# Patient Record
Sex: Male | Born: 1955
Health system: Southern US, Community
[De-identification: ages and names within clinical notes are randomized; demographics above are authoritative.]

## PROBLEM LIST (undated history)

## (undated) DIAGNOSIS — N210 Calculus in bladder: Secondary | ICD-10-CM

## (undated) DIAGNOSIS — Z87442 Personal history of urinary calculi: Secondary | ICD-10-CM

## (undated) DIAGNOSIS — K219 Gastro-esophageal reflux disease without esophagitis: Secondary | ICD-10-CM

## (undated) HISTORY — PX: COLONOSCOPY: SHX174

---

## 2017-10-31 DIAGNOSIS — H1031 Unspecified acute conjunctivitis, right eye: Secondary | ICD-10-CM | POA: Diagnosis not present

## 2017-11-16 DIAGNOSIS — R3989 Other symptoms and signs involving the genitourinary system: Secondary | ICD-10-CM | POA: Diagnosis not present

## 2017-11-16 DIAGNOSIS — R3129 Other microscopic hematuria: Secondary | ICD-10-CM | POA: Diagnosis not present

## 2017-11-21 ENCOUNTER — Other Ambulatory Visit: Payer: Self-pay | Admitting: Family Medicine

## 2017-11-21 DIAGNOSIS — R3129 Other microscopic hematuria: Secondary | ICD-10-CM

## 2017-12-04 DIAGNOSIS — N210 Calculus in bladder: Secondary | ICD-10-CM

## 2017-12-04 HISTORY — DX: Calculus in bladder: N21.0

## 2017-12-18 ENCOUNTER — Ambulatory Visit
Admission: RE | Admit: 2017-12-18 | Discharge: 2017-12-18 | Disposition: A | Discharge status: Home / Self Care | Payer: 59 | Source: Ambulatory Visit | Attending: Family Medicine | Admitting: Family Medicine

## 2017-12-18 DIAGNOSIS — N219 Calculus of lower urinary tract, unspecified: Secondary | ICD-10-CM | POA: Diagnosis not present

## 2017-12-18 DIAGNOSIS — R3129 Other microscopic hematuria: Secondary | ICD-10-CM

## 2018-01-12 DIAGNOSIS — N210 Calculus in bladder: Secondary | ICD-10-CM | POA: Diagnosis not present

## 2018-01-12 DIAGNOSIS — N4341 Spermatocele of epididymis, single: Secondary | ICD-10-CM | POA: Diagnosis not present

## 2018-01-15 ENCOUNTER — Encounter (HOSPITAL_BASED_OUTPATIENT_CLINIC_OR_DEPARTMENT_OTHER): Payer: Self-pay

## 2018-01-15 ENCOUNTER — Other Ambulatory Visit: Payer: Self-pay | Admitting: Urology

## 2018-01-16 ENCOUNTER — Encounter (HOSPITAL_BASED_OUTPATIENT_CLINIC_OR_DEPARTMENT_OTHER): Payer: Self-pay

## 2018-01-16 ENCOUNTER — Other Ambulatory Visit: Payer: Self-pay

## 2018-01-16 NOTE — Progress Notes (Signed)
Spoke with:  Renae FicklePaul NPO:  After Midnight, no gum, candy, or mints   Arrival time: [id number] Labs: N/A AM medications: None Pre op orders: needs second sign Ride home: Darral DashLucretia (wife) [phone]

## 2018-01-19 NOTE — Discharge Instructions (Signed)

## 2018-01-19 NOTE — H&P (Signed)
HPI: Jonathan Hunt is a 62 year-old male with bladder stones.  His problem was discovered 12/18/2017. The diagnosis was made by Dr. Susa Simmonds. He has had no symptoms.   He has not had prior urinary tract or prostate infections.   He does not have urgency. He does not have frequency. He does not have trouble emptying his bladder at this time. He does not have pain or burning when he urinates.   He has not previously had an indwelling catheter in for more than two weeks at a time. He has had no prostate surgery.   01/12/18: He presented with urethral pain and discomfort in the left groin and reports he had intermittently experience these symptoms for approximately 2 years. They have become progressively worse. He has no history of renal calculi. A CT scan was obtained which revealed a 2.4 x 1.9 cm stone in the dependent portion of his bladder with a 2nd smaller stone present as well. No renal calculi were present. He reports having minimal obstructive voiding symptoms with some slight slowing of his stream over the years and may be some slight increased frequency. He gets up at night to urinate but says he sleeps very poorly in therefore gets up to urinate when he is awake at night.     ALLERGIES: Penicillin    MEDICATIONS: None   GU PSH: None   NON-GU PSH: None   GU PMH: None   NON-GU PMH: None   FAMILY HISTORY: 1 Daughter - Runs in Family 1 son - Runs in Family Colon Cancer - Mother   SOCIAL HISTORY: Marital Status: Married Preferred Language: English; Ethnicity: Not Hispanic Or Latino; Race: White Current Smoking Status: Patient has never smoked.   Tobacco Use Assessment Completed: Used Tobacco in last 30 days? Social Drinker.  Drinks 4+ caffeinated drinks per day.    REVIEW OF SYSTEMS:    GU Review Male:   Patient reports get up at night to urinate and penile pain. Patient denies frequent urination, hard to postpone urination, burning/ pain with urination, leakage of urine, stream  starts and stops, trouble starting your stream, have to strain to urinate , and erection problems.  Gastrointestinal (Upper):   Patient denies nausea, vomiting, and indigestion/ heartburn.  Gastrointestinal (Lower):   Patient denies diarrhea and constipation.  Constitutional:   Patient denies fever, night sweats, weight loss, and fatigue.  Skin:   Patient denies skin rash/ lesion and itching.  Eyes:   Patient denies blurred vision and double vision.  Ears/ Nose/ Throat:   Patient denies sore throat and sinus problems.  Hematologic/Lymphatic:   Patient denies swollen glands and easy bruising.  Cardiovascular:   Patient denies leg swelling and chest pains.  Respiratory:   Patient denies cough and shortness of breath.  Endocrine:   Patient denies excessive thirst.  Musculoskeletal:   Patient denies back pain and joint pain.  Neurological:   Patient denies headaches and dizziness.  Psychologic:   Patient denies depression and anxiety.   VITAL SIGNS: None   GU PHYSICAL EXAMINATION:    Anus and Perineum: No hemorrhoids. No anal stenosis. No rectal fissure, no anal fissure. No edema, no dimple, no perineal tenderness, no anal tenderness.  Scrotum: No lesions. No edema. No cysts. No warts.  Epididymides: Left: left head contains a spermatocele. Right: No spermatocele, no masses, no cysts, no tenderness, no induration, no enlargement. Left: No masses, no cysts, no tenderness, no induration, no enlargement.   Testes: No tenderness, no swelling, no  enlargement left testes. No tenderness, no swelling, no enlargement right testes. Normal location left testes. Normal location right testes. No mass, no cyst, no varicocele, no hydrocele left testes. No mass, no cyst, no varicocele, no hydrocele right testes.  Urethral Meatus: Normal size. No lesion, no wart, no discharge, no polyp. Normal location.  Penis: Circumcised, no warts, no cracks. No dorsal Peyronie's plaques, no left corporal Peyronie's plaques, no  right corporal Peyronie's plaques, no scarring, no warts. No balanitis, no meatal stenosis.  Prostate: 40 gram or 2+ size. Left lobe normal consistency, right lobe normal consistency. Symmetrical lobes. No prostate nodule. Left lobe no tenderness, right lobe no tenderness.  Seminal Vesicles: Nonpalpable.  Sphincter Tone: Normal sphincter. No rectal tenderness. No rectal mass.    MULTI-SYSTEM PHYSICAL EXAMINATION:    Constitutional: Well-nourished. No physical deformities. Normally developed. Good grooming.  Neck: Neck symmetrical, not swollen. Normal tracheal position.  Respiratory: No labored breathing, no use of accessory muscles.   Cardiovascular: Normal temperature, normal extremity pulses, no swelling, no varicosities.  Lymphatic: No enlargement of neck, axillae, groin.  Skin: No paleness, no jaundice, no cyanosis. No lesion, no ulcer, no rash.  Neurologic / Psychiatric: Oriented to time, oriented to place, oriented to person. No depression, no anxiety, no agitation.  Gastrointestinal: No mass, no tenderness, no rigidity, non obese abdomen.  Eyes: Normal conjunctivae. Normal eyelids.  Ears, Nose, Mouth, and Throat: Left ear no scars, no lesions, no masses. Right ear no scars, no lesions, no masses. Nose no scars, no lesions, no masses. Normal hearing. Normal lips.  Musculoskeletal: Normal gait and station of head and neck.     PAST DATA REVIEWED:  Source Of History:  Patient  Lab Test Review:   PSA, BUN/Creatinine  Records Review:   Previous Doctor Records, Previous Hospital Records, POC Tool  Urine Test Review:   Urinalysis  X-Ray Review: C.T. Abdomen/Pelvis: Reviewed Films. Reviewed Report. Discussed With Patient. His bladder stone has Hounsfield units of 1400   Notes:                     In 6/19 he was noted to have a normal creatinine of 0.95 with a PSA that was 1.10. His urinalysis had trace blood but no sign of infection.   PROCEDURES:          Urinalysis w/Scope Dipstick  Dipstick Cont'd Micro  Color: Yellow Bilirubin: Neg mg/dL WBC/hpf: 0 - 5/hpf  Appearance: Clear Ketones: Neg mg/dL RBC/hpf: 0 - 2/hpf  Specific Gravity: 1.020 Blood: Neg ery/uL Bacteria: Rare (0-9/hpf)  pH: 5.5 Protein: 1+ mg/dL Cystals: NS (Not Seen)  Glucose: Neg mg/dL Urobilinogen: 0.2 mg/dL Casts: Hyaline    Nitrites: Neg Trichomonas: Not Present    Leukocyte Esterase: Neg leu/uL Mucous: Present      Epithelial Cells: 0 - 5/hpf      Yeast: NS (Not Seen)      Sperm: Not Present    ASSESSMENT/PLAN:     ICD-10 Details  1 GU:   Bladder Stone - N21.0 He would like to proceed with laser lithotripsy of his stone. I therefore discussed the procedure with him in detail. We discussed how the procedure is performed, the probability of success, the risks and complications of the procedure as well as the outpatient nature and the anticipated postoperative course. He understands and has elected to proceed.  2   Spermatocele (single) - N43.41 Left, I noted a spermatocele in the head of his left epididymis. This is never  given him any problems. We discussed the benign nature of this finding.

## 2018-01-22 ENCOUNTER — Encounter (HOSPITAL_BASED_OUTPATIENT_CLINIC_OR_DEPARTMENT_OTHER)
Admission: RE | Disposition: A | Discharge status: Home / Self Care | Payer: Self-pay | Source: Ambulatory Visit | Attending: Urology

## 2018-01-22 ENCOUNTER — Ambulatory Visit (HOSPITAL_BASED_OUTPATIENT_CLINIC_OR_DEPARTMENT_OTHER)
Admission: RE | Admit: 2018-01-22 | Discharge: 2018-01-22 | Disposition: A | Discharge status: Home / Self Care | Payer: 59 | Source: Ambulatory Visit | Attending: Urology | Admitting: Urology

## 2018-01-22 ENCOUNTER — Ambulatory Visit (HOSPITAL_BASED_OUTPATIENT_CLINIC_OR_DEPARTMENT_OTHER): Payer: 59 | Admitting: Anesthesiology

## 2018-01-22 ENCOUNTER — Other Ambulatory Visit: Payer: Self-pay

## 2018-01-22 ENCOUNTER — Encounter (HOSPITAL_BASED_OUTPATIENT_CLINIC_OR_DEPARTMENT_OTHER): Payer: Self-pay | Admitting: Anesthesiology

## 2018-01-22 DIAGNOSIS — N2889 Other specified disorders of kidney and ureter: Secondary | ICD-10-CM | POA: Diagnosis not present

## 2018-01-22 DIAGNOSIS — N210 Calculus in bladder: Secondary | ICD-10-CM | POA: Insufficient documentation

## 2018-01-22 DIAGNOSIS — Z880 Allergy status to penicillin: Secondary | ICD-10-CM | POA: Insufficient documentation

## 2018-01-22 DIAGNOSIS — N3080 Other cystitis without hematuria: Secondary | ICD-10-CM | POA: Diagnosis not present

## 2018-01-22 DIAGNOSIS — I10 Essential (primary) hypertension: Secondary | ICD-10-CM | POA: Diagnosis not present

## 2018-01-22 HISTORY — DX: Calculus in bladder: N21.0

## 2018-01-22 HISTORY — DX: Personal history of urinary calculi: Z87.442

## 2018-01-22 HISTORY — PX: CYSTOSCOPY WITH LITHOLAPAXY: SHX1425

## 2018-01-22 HISTORY — DX: Gastro-esophageal reflux disease without esophagitis: K21.9

## 2018-01-22 SURGERY — CYSTOSCOPY, WITH BLADDER CALCULUS LITHOLAPAXY
Anesthesia: General | Site: Bladder

## 2018-01-22 MED ORDER — LIDOCAINE 2% (20 MG/ML) 5 ML SYRINGE
INTRAMUSCULAR | Status: DC | PRN
  Administered 2018-01-22: 60 mg via INTRAVENOUS

## 2018-01-22 MED ORDER — PHENAZOPYRIDINE HCL 200 MG PO TABS: 200.0000 mg | ORAL_TABLET | Freq: Three times a day (TID) | ORAL | 0 refills | Status: AC | PRN

## 2018-01-22 MED ORDER — FENTANYL CITRATE (PF) 100 MCG/2ML IJ SOLN
INTRAMUSCULAR | Status: AC
  Filled 2018-01-22: qty 2

## 2018-01-22 MED ORDER — INDIGOTINDISULFONATE SODIUM 8 MG/ML IJ SOLN
INTRAMUSCULAR | Status: DC | PRN
  Administered 2018-01-22: 40 mg via INTRAVENOUS

## 2018-01-22 MED ORDER — OXYCODONE HCL 5 MG PO TABS
5.0000 mg | ORAL_TABLET | Freq: Once | ORAL | Status: DC | PRN
  Filled 2018-01-22: qty 1

## 2018-01-22 MED ORDER — ONDANSETRON HCL 4 MG/2ML IJ SOLN
4.0000 mg | Freq: Once | INTRAMUSCULAR | Status: DC | PRN
  Filled 2018-01-22: qty 2

## 2018-01-22 MED ORDER — KETOROLAC TROMETHAMINE 30 MG/ML IJ SOLN
INTRAMUSCULAR | Status: AC
  Filled 2018-01-22: qty 1

## 2018-01-22 MED ORDER — MIDAZOLAM HCL 5 MG/5ML IJ SOLN
INTRAMUSCULAR | Status: DC | PRN
  Administered 2018-01-22: 2 mg via INTRAVENOUS

## 2018-01-22 MED ORDER — DEXAMETHASONE SODIUM PHOSPHATE 10 MG/ML IJ SOLN
INTRAMUSCULAR | Status: AC
  Filled 2018-01-22: qty 1

## 2018-01-22 MED ORDER — IBUPROFEN 100 MG/5ML PO SUSP
200.0000 mg | Freq: Four times a day (QID) | ORAL | Status: DC | PRN
  Filled 2018-01-22: qty 30

## 2018-01-22 MED ORDER — DEXAMETHASONE SODIUM PHOSPHATE 4 MG/ML IJ SOLN
INTRAMUSCULAR | Status: DC | PRN
  Administered 2018-01-22: 10 mg via INTRAVENOUS

## 2018-01-22 MED ORDER — MIDAZOLAM HCL 2 MG/2ML IJ SOLN
INTRAMUSCULAR | Status: AC
  Filled 2018-01-22: qty 2

## 2018-01-22 MED ORDER — HYDROCODONE-ACETAMINOPHEN 10-325 MG PO TABS: 1.0000 | ORAL_TABLET | ORAL | 0 refills | Status: AC | PRN

## 2018-01-22 MED ORDER — ONDANSETRON HCL 4 MG/2ML IJ SOLN
INTRAMUSCULAR | Status: DC | PRN
  Administered 2018-01-22: 4 mg via INTRAVENOUS

## 2018-01-22 MED ORDER — IBUPROFEN 200 MG PO TABS
200.0000 mg | ORAL_TABLET | Freq: Four times a day (QID) | ORAL | Status: DC | PRN
  Filled 2018-01-22: qty 2

## 2018-01-22 MED ORDER — PHENYLEPHRINE 40 MCG/ML (10ML) SYRINGE FOR IV PUSH (FOR BLOOD PRESSURE SUPPORT)
PREFILLED_SYRINGE | INTRAVENOUS | Status: DC | PRN
  Administered 2018-01-22: 40 ug via INTRAVENOUS

## 2018-01-22 MED ORDER — FENTANYL CITRATE (PF) 100 MCG/2ML IJ SOLN
INTRAMUSCULAR | Status: DC | PRN
  Administered 2018-01-22 (×2): 50 ug via INTRAVENOUS

## 2018-01-22 MED ORDER — PROPOFOL 10 MG/ML IV BOLUS
INTRAVENOUS | Status: DC | PRN
  Administered 2018-01-22: 160 mg via INTRAVENOUS

## 2018-01-22 MED ORDER — ONDANSETRON HCL 4 MG/2ML IJ SOLN
INTRAMUSCULAR | Status: AC
  Filled 2018-01-22: qty 2

## 2018-01-22 MED ORDER — FENTANYL CITRATE (PF) 100 MCG/2ML IJ SOLN
25.0000 ug | INTRAMUSCULAR | Status: DC | PRN
  Administered 2018-01-22: 25 ug via INTRAVENOUS
  Filled 2018-01-22: qty 1

## 2018-01-22 MED ORDER — OXYCODONE HCL 5 MG/5ML PO SOLN
5.0000 mg | Freq: Once | ORAL | Status: DC | PRN
  Filled 2018-01-22: qty 5

## 2018-01-22 MED ORDER — SODIUM CHLORIDE 0.9 % IR SOLN
Status: DC | PRN
  Administered 2018-01-22 (×3): 3000 mL via INTRAVESICAL

## 2018-01-22 MED ORDER — KETOROLAC TROMETHAMINE 30 MG/ML IJ SOLN
30.0000 mg | Freq: Once | INTRAMUSCULAR | Status: AC | PRN
  Administered 2018-01-22: 30 mg via INTRAVENOUS
  Filled 2018-01-22: qty 1

## 2018-01-22 MED ORDER — STERILE WATER FOR IRRIGATION IR SOLN
Status: DC | PRN
  Administered 2018-01-22: 3000 mL

## 2018-01-22 MED ORDER — LACTATED RINGERS IV SOLN
INTRAVENOUS | Status: DC
  Administered 2018-01-22 (×2): via INTRAVENOUS
  Filled 2018-01-22: qty 1000

## 2018-01-22 MED ORDER — LIDOCAINE 2% (20 MG/ML) 5 ML SYRINGE
INTRAMUSCULAR | Status: AC
  Filled 2018-01-22: qty 5

## 2018-01-22 MED ORDER — CIPROFLOXACIN IN D5W 400 MG/200ML IV SOLN
INTRAVENOUS | Status: AC
  Filled 2018-01-22: qty 200

## 2018-01-22 MED ORDER — CIPROFLOXACIN IN D5W 400 MG/200ML IV SOLN
400.0000 mg | Freq: Once | INTRAVENOUS | Status: AC
  Administered 2018-01-22: 400 mg via INTRAVENOUS
  Filled 2018-01-22: qty 200

## 2018-01-22 MED ORDER — MEPERIDINE HCL 25 MG/ML IJ SOLN
6.2500 mg | INTRAMUSCULAR | Status: DC | PRN
  Filled 2018-01-22: qty 1

## 2018-01-22 SURGICAL SUPPLY — 27 items
BAG DRAIN URO-CYSTO SKYTR STRL (DRAIN) ×2 IMPLANT
BAG URINE LEG 19OZ MD ST LTX (BAG) ×2 IMPLANT
CATH FOLEY 2WAY SLVR  5CC 20FR (CATHETERS) ×1
CATH FOLEY 2WAY SLVR 5CC 20FR (CATHETERS) ×1 IMPLANT
CATH INTERMIT  6FR 70CM (CATHETERS) IMPLANT
CATH URET 5FR 28IN CONE TIP (BALLOONS)
CATH URET 5FR 70CM CONE TIP (BALLOONS) IMPLANT
CLOTH BEACON ORANGE TIMEOUT ST (SAFETY) ×2 IMPLANT
GLOVE BIO SURGEON STRL SZ 6.5 (GLOVE) ×2 IMPLANT
GLOVE BIO SURGEON STRL SZ8 (GLOVE) ×2 IMPLANT
GLOVE BIOGEL PI IND STRL 6.5 (GLOVE) ×1 IMPLANT
GLOVE BIOGEL PI INDICATOR 6.5 (GLOVE) ×1
GOWN STRL REUS W/ TWL XL LVL3 (GOWN DISPOSABLE) ×1 IMPLANT
GOWN STRL REUS W/TWL LRG LVL3 (GOWN DISPOSABLE) ×2 IMPLANT
GOWN STRL REUS W/TWL XL LVL3 (GOWN DISPOSABLE) ×1
GOWN XL W/COTTON TOWEL STD (GOWNS) ×2 IMPLANT
GUIDEWIRE 0.038 PTFE COATED (WIRE) ×2 IMPLANT
GUIDEWIRE ANG ZIPWIRE 038X150 (WIRE) IMPLANT
GUIDEWIRE STR DUAL SENSOR (WIRE) ×2 IMPLANT
IV NS IRRIG 3000ML ARTHROMATIC (IV SOLUTION) ×6 IMPLANT
KIT TURNOVER CYSTO (KITS) ×2 IMPLANT
LOOP CUT BIPOLAR 24F LRG (ELECTROSURGICAL) ×2 IMPLANT
MANIFOLD NEPTUNE II (INSTRUMENTS) ×2 IMPLANT
NS IRRIG 500ML POUR BTL (IV SOLUTION) IMPLANT
PACK CYSTO (CUSTOM PROCEDURE TRAY) ×2 IMPLANT
TUBE CONNECTING 12X1/4 (SUCTIONS) ×2 IMPLANT
WATER STERILE IRR 3000ML UROMA (IV SOLUTION) ×2 IMPLANT

## 2018-01-22 NOTE — Op Note (Signed)
PATIENT:  Jonathan Raveaul Edward Huynh Jr.  PRE-OPERATIVE DIAGNOSIS: Bladder calculi  POST-OPERATIVE DIAGNOSIS: 1.  Bladder calculi (2.4 cm) 2.  Left ureterocele  PROCEDURE: 1.  Transurethral unroofing of ureterocele. 2.  Cystolitholapaxy  SURGEON:  Garnett FarmMark C Nixon Kolton  INDICATION: Jonathan Raveaul Edward Calia Jr. is a 62 year old male who was experiencing urethral discomfort and left groin pain intermittently over 2 years.  The pain had become progressively worse and a CT scan was obtained which revealed a 2.4 cm stone in the dependent portion of the bladder with a second smaller stone adjacent to it.  There was no evidence of hydronephrosis.  He is brought to the operating room today for cystolitholapaxy.  ANESTHESIA:  General  EBL:  Minimal  DRAINS: 20 French Foley catheter  LOCAL MEDICATIONS USED:  None  SPECIMEN: 1.  Portions of ureterocele to pathology. 2.  Stone given to patient.  Description of procedure: After informed consent the patient was taken to the operating room and placed on the table in a supine position. General anesthesia was then administered. Once fully anesthetized the patient was moved to the dorsal lithotomy position and the genitalia were sterilely prepped and draped in standard fashion. An official timeout was then performed.  I passed the 23 French rigid cystoscope with 30 degree lens down the urethra which was noted to be normal.  The prostatic urethra revealed elongation with some lateral lobe hypertrophy and a high bladder neck.  Upon entering the bladder I noted 1+ trabeculation.  The right UO was noted to be normal but there was a very inflamed appearing mass in the location of the left ureteral orifice.  I used a 70 degree lens but was unable to find the ureteral orifice.  I also could not see the stone.  It appeared obvious to me that the stone was in a large ureterocele.  I administered 1 ampoule of indigo carmine and then inserted the resectoscope with loop.  I could see  indigocarmine coming from the right UO but was not able to definitively identify the left UO although I did see blue coming from the region of the mass.  I used the loop to incise over the top of the mass and in doing so exposed the large stone.  I resected about one half of the ureterocele and then used the beak of the scope to tease the stone from its current location.  The second stone was removed from that location as well and both were noted on the floor of the bladder.  I inspected the ureterocele and found a very large left ureteral orifice at the base.  I then fulgurated the edges of the ureterocele to control bleeding and then remove the resectoscope.  I reinserted the cystoscope and used the 1000 m holmium laser fiber to fragment the stone.  Once it was fully fragmented I reinserted the resectoscope sheath and used the Microvasive evacuator to remove all stone fragments.  Reinspection of the bladder revealed all stone had been removed, the mucosa was intact and there was minimal bleeding.  I used the resectoscope loop to then fulgurate areas of small oozing from the edge of the resected ureterocele and also at the bladder neck at the 6 o'clock position where the scope and passed into the bladder over the high bladder neck.  The bladder was then drained, the resectoscope was removed and a 20 French Foley catheter was inserted.  The patient was then awakened and taken to the recovery room in stable and  satisfactory condition.  He tolerated the procedure well with no intraoperative complications.  PLAN OF CARE: Discharge to home after PACU  PATIENT DISPOSITION:  PACU - hemodynamically stable.

## 2018-01-22 NOTE — Anesthesia Postprocedure Evaluation (Signed)
Anesthesia Post Note  Patient: Jonathan Raveaul Edward Saxer Jr.  Procedure(s) Performed: CYSTOSCOPY WITH LITHOLAPAXY (N/A Bladder)     Patient location during evaluation: PACU Anesthesia Type: General Level of consciousness: sedated Pain management: pain level controlled Vital Signs Assessment: post-procedure vital signs reviewed and stable Respiratory status: spontaneous breathing and respiratory function stable Cardiovascular status: stable Postop Assessment: no apparent nausea or vomiting Anesthetic complications: no    Last Vitals:  Vitals:   01/22/18 1200 01/22/18 1215  BP: 120/77 133/88  Pulse: (!) 49 (!) 52  Resp: 15 14  Temp:    SpO2: 98% 95%    Last Pain:  Vitals:   01/22/18 1215  TempSrc:   PainSc: 3                  Jonathan Hunt

## 2018-01-22 NOTE — Anesthesia Procedure Notes (Signed)
Procedure Name: LMA Insertion Date/Time: 01/22/2018 10:11 AM Performed by: Caren Macadamarter, Joneisha Miles W, CRNA Pre-anesthesia Checklist: Patient identified, Emergency Drugs available, Suction available and Patient being monitored Patient Re-evaluated:Patient Re-evaluated prior to induction Oxygen Delivery Method: Circle system utilized Preoxygenation: Pre-oxygenation with 100% oxygen Induction Type: IV induction Ventilation: Mask ventilation without difficulty LMA: LMA inserted LMA Size: 5.0 Number of attempts: 1 Placement Confirmation: positive ETCO2 and breath sounds checked- equal and bilateral Tube secured with: Tape Dental Injury: Teeth and Oropharynx as per pre-operative assessment

## 2018-01-22 NOTE — Anesthesia Preprocedure Evaluation (Signed)
Anesthesia Evaluation  Patient identified by MRN, date of birth, ID band Patient awake    Reviewed: Allergy & Precautions, NPO status , Patient's Chart, lab work & pertinent test results  Airway Mallampati: I       Dental no notable dental hx. (+) Teeth Intact   Pulmonary neg pulmonary ROS,    Pulmonary exam normal breath sounds clear to auscultation       Cardiovascular hypertension, Normal cardiovascular exam Rhythm:Regular Rate:Normal     Neuro/Psych negative neurological ROS  negative psych ROS   GI/Hepatic Neg liver ROS,   Endo/Other  negative endocrine ROS  Renal/GU negative Renal ROS     Musculoskeletal negative musculoskeletal ROS (+)   Abdominal Normal abdominal exam  (+)   Peds  Hematology   Anesthesia Other Findings   Reproductive/Obstetrics                             Anesthesia Physical Anesthesia Plan  ASA: II  Anesthesia Plan: General   Post-op Pain Management:    Induction: Intravenous  PONV Risk Score and Plan: 4 or greater and Ondansetron, Dexamethasone and Midazolam  Airway Management Planned: LMA  Additional Equipment:   Intra-op Plan:   Post-operative Plan: Extubation in OR  Informed Consent: I have reviewed the patients History and Physical, chart, labs and discussed the procedure including the risks, benefits and alternatives for the proposed anesthesia with the patient or authorized representative who has indicated his/her understanding and acceptance.   Dental advisory given  Plan Discussed with: CRNA and Surgeon  Anesthesia Plan Comments:         Anesthesia Quick Evaluation

## 2018-01-22 NOTE — Transfer of Care (Signed)
Immediate Anesthesia Transfer of Care Note  Patient: Jonathan Raveaul Edward Phimmasone Jr.  Procedure(s) Performed: CYSTOSCOPY WITH LITHOLAPAXY (N/A Bladder)  Patient Location: PACU  Anesthesia Type:General  Level of Consciousness: awake, alert  and oriented  Airway & Oxygen Therapy: Patient Spontanous Breathing and Patient connected to face mask oxygen  Post-op Assessment: Report given to RN and Post -op Vital signs reviewed and stable  Post vital signs: Reviewed and stable  Last Vitals:  Vitals Value Taken Time  BP    Temp    Pulse    Resp    SpO2      Last Pain:  Vitals:   01/22/18 0824  TempSrc:   PainSc: 0-No pain      Patients Stated Pain Goal: 4 (01/22/18 0824)  Complications: No apparent anesthesia complications

## 2018-01-23 ENCOUNTER — Encounter (HOSPITAL_BASED_OUTPATIENT_CLINIC_OR_DEPARTMENT_OTHER): Payer: Self-pay | Admitting: Urology

## 2018-02-01 DIAGNOSIS — N210 Calculus in bladder: Secondary | ICD-10-CM | POA: Diagnosis not present

## 2018-07-12 DIAGNOSIS — Z1159 Encounter for screening for other viral diseases: Secondary | ICD-10-CM | POA: Diagnosis not present

## 2018-07-12 DIAGNOSIS — E782 Mixed hyperlipidemia: Secondary | ICD-10-CM | POA: Diagnosis not present

## 2018-07-12 DIAGNOSIS — Z0000 Encounter for general adult medical examination without abnormal findings: Secondary | ICD-10-CM | POA: Diagnosis not present

## 2018-07-12 DIAGNOSIS — Z23 Encounter for immunization: Secondary | ICD-10-CM | POA: Diagnosis not present

## 2019-06-28 ENCOUNTER — Ambulatory Visit: Payer: Medicare Other | Attending: Internal Medicine

## 2019-06-28 DIAGNOSIS — Z23 Encounter for immunization: Secondary | ICD-10-CM

## 2019-06-28 NOTE — Progress Notes (Signed)
   [id number]  Name:  Rachel Samples.    MRN: [id number] DOB: September 01, 1955  06/28/2019  Mr. Favata was observed post Covid-19 immunization for 15 minutes without incidence. He was provided with Vaccine Information Sheet and instruction to access the V-Safe system.   Mr. Fearn was instructed to call 911 with any severe reactions post vaccine: Marland Kitchen Difficulty breathing  . Swelling of your face and throat  . A fast heartbeat  . A bad rash all over your body  . Dizziness and weakness    Immunizations Administered    Name Date Dose VIS Date Route   Pfizer COVID-19 Vaccine 06/28/2019  4:41 PM 0.3 mL 05/17/2019 Intramuscular   Manufacturer: [employer], [employer]   Lot: [id number]   NDC: [address]-4   Pfizer COVID-19 Vaccine 06/28/2019  4:47 PM 0.3 mL 05/17/2019 Intramuscular   Manufacturer: [employer], [employer]   Lot: [id number]   NDC: [address]-7

## 2019-07-19 ENCOUNTER — Ambulatory Visit: Payer: Medicare Other | Attending: Internal Medicine

## 2019-07-19 DIAGNOSIS — Z23 Encounter for immunization: Secondary | ICD-10-CM | POA: Insufficient documentation

## 2019-07-19 NOTE — Progress Notes (Signed)
   [id number]  Name:  Woods Gangemi.    MRN: [id number] DOB: June 08, 1955  07/19/2019  Mr. Hausen was observed post Covid-19 immunization for 15 minutes without incidence. He was provided with Vaccine Information Sheet and instruction to access the V-Safe system.   Mr. Yero was instructed to call 911 with any severe reactions post vaccine: Marland Kitchen Difficulty breathing  . Swelling of your face and throat  . A fast heartbeat  . A bad rash all over your body  . Dizziness and weakness    Immunizations Administered    Name Date Dose VIS Date Route   Pfizer COVID-19 Vaccine 07/19/2019  4:19 PM 0.3 mL 05/17/2019 Intramuscular   Manufacturer: [employer], [employer]   Lot: [id number]   NDC: [address]-6

## 2019-10-19 IMAGING — CT CT ABD-PELV W/O CM
2 of 4 series · 13 of 46 positions shown, 15 images · non-contrast
Comparison: None.

CLINICAL DATA: Records. Hematuria, dysuria and left lower quadrant
abdominal discomfort.

EXAM:
CT ABDOMEN AND PELVIS WITHOUT CONTRAST
TECHNIQUE: Multidetector CT imaging of the abdomen and pelvis was performed
following the standard protocol without IV contrast.

[Series 2: renal stone 5.00 br40 s3 ax · axial · 0.62mm/px · z∈[+1180,+1585]mm · 10 of 99 slices shown, 12 images]
[im 9/99  soft-tissue]
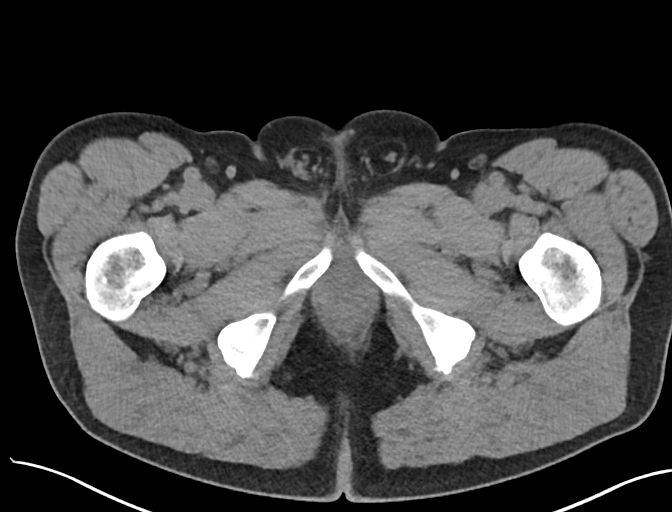
[im 9/99  bone]
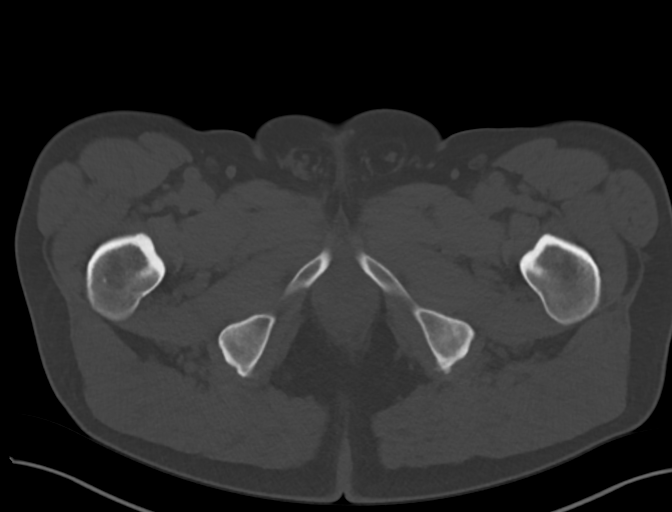
[im 17/99  soft-tissue]
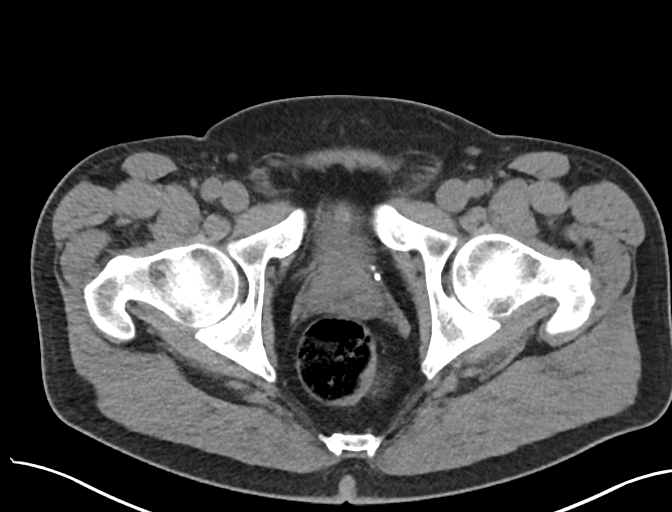
[im 25/99  soft-tissue]
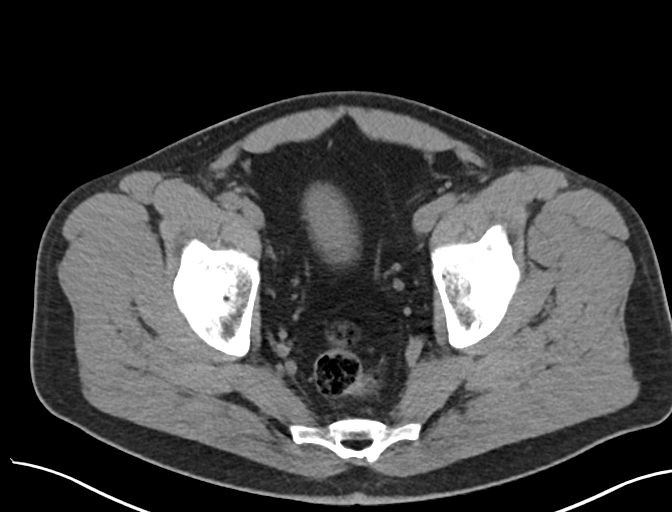
[im 37/99  soft-tissue]
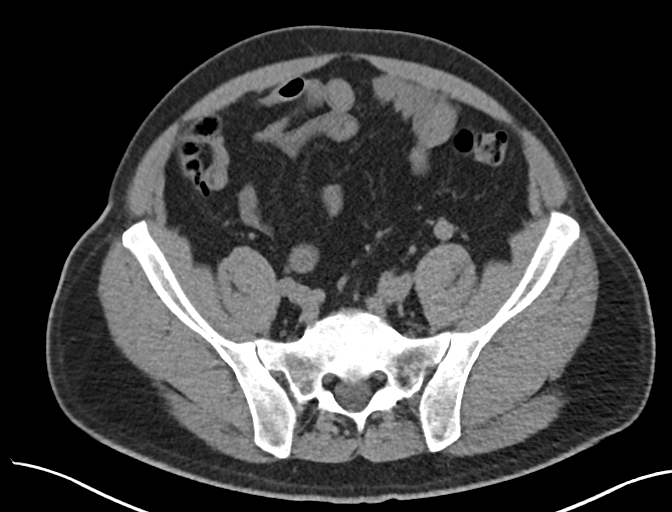
[im 45/99  soft-tissue]
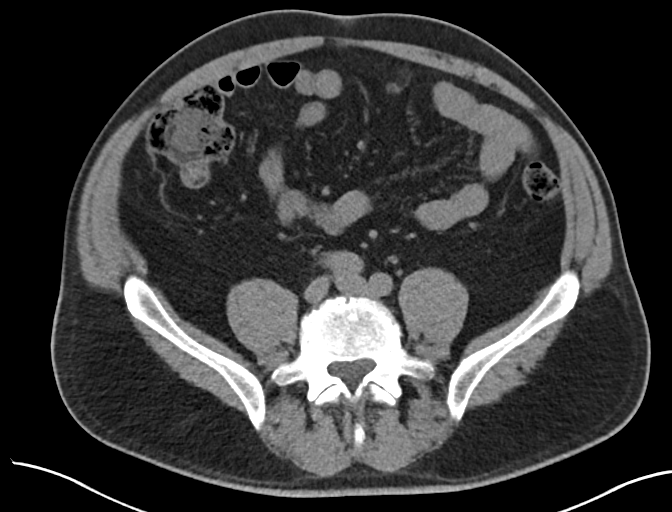
[im 54/99  soft-tissue]
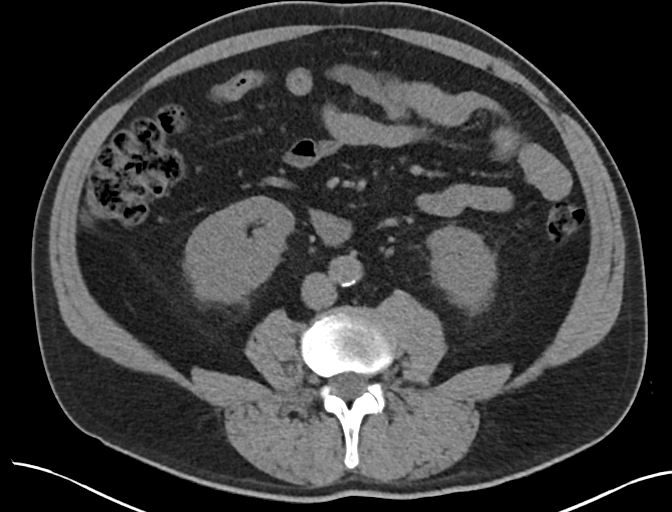
[im 62/99  soft-tissue]
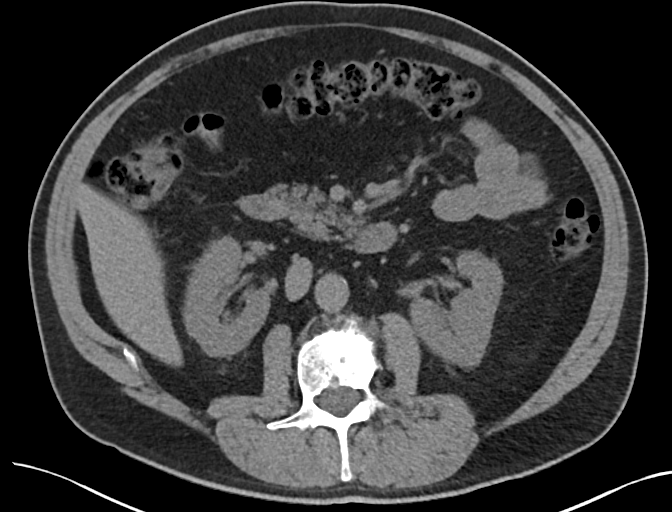
[im 74/99  soft-tissue]
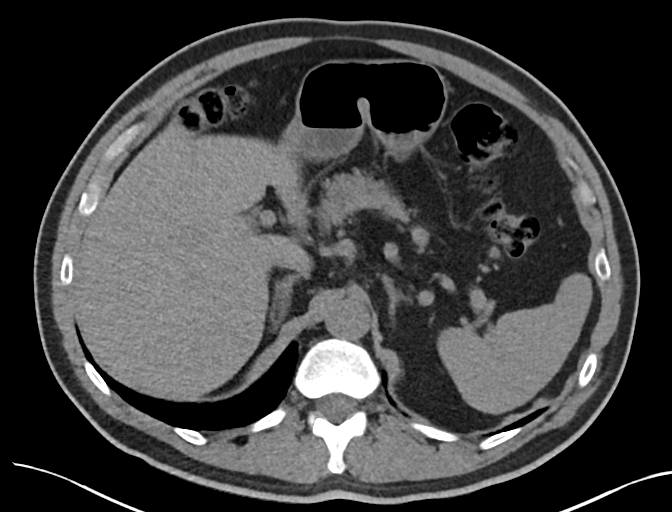
[im 82/99  soft-tissue]
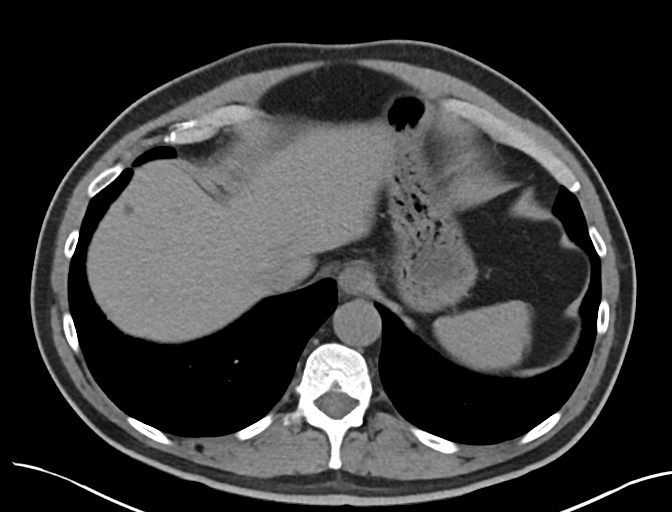
[im 82/99  bone]
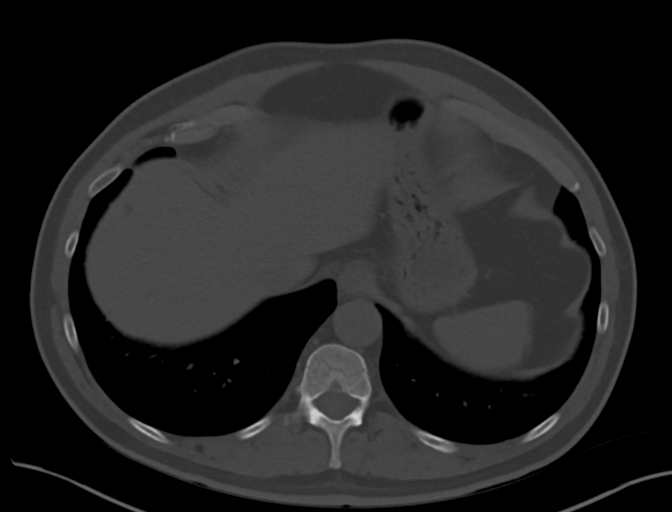
[im 90/99  soft-tissue]
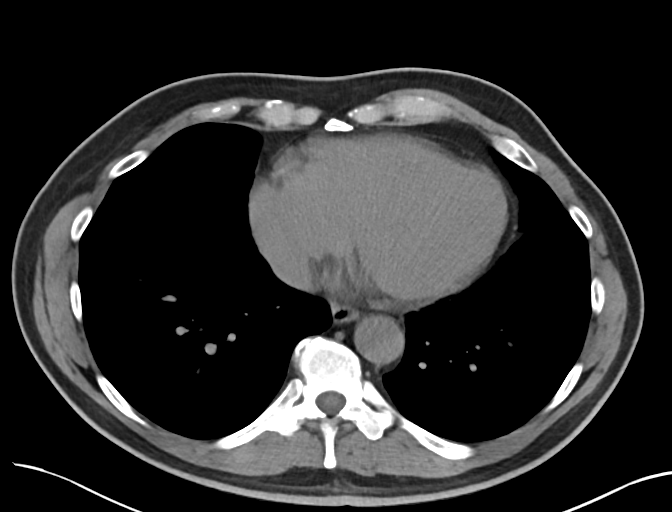

[Series 6: renal stone 2.00 br40 s3 cor · coronal · 0.81mm/px · 3 of 165 slices shown]
[im 55/165  soft-tissue]
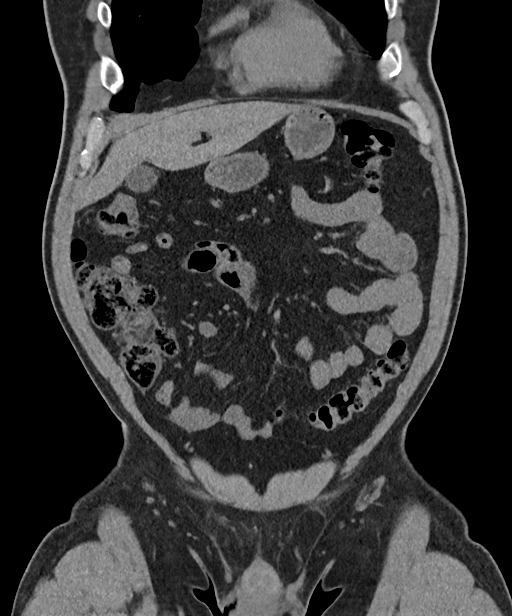
[im 73/165  soft-tissue]
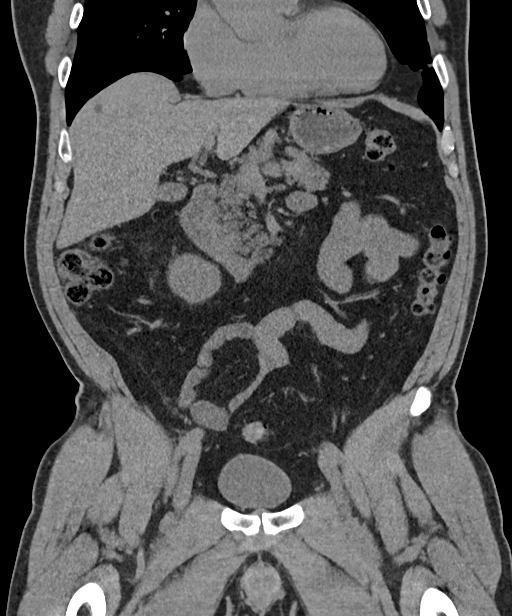
[im 92/165  soft-tissue]
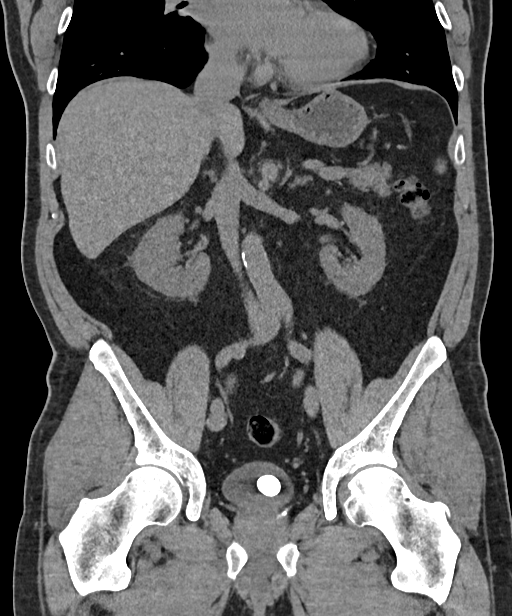

[13 of 46 positions shown; findings below may reference images not displayed]

FINDINGS: Lower chest: No acute abnormality.

Hepatobiliary: There are some scattered benign cysts in the liver.
The largest measures 1.6 cm at the dome of the right lobe.. No
gallstones, gallbladder wall thickening, or biliary dilatation.

Pancreas: Unremarkable. No pancreatic ductal dilatation or
surrounding inflammatory changes.

Spleen: Normal in size without focal abnormality.

Adrenals/Urinary Tract: Adrenal glands and kidneys have a normal
appearance by unenhanced CT. No evidence of hydronephrosis. No renal
or ureteral calculi are identified. There is a densely calcified
calculus within the dependent bladder measuring approximately 2.4 x
1.9 x 1.9 cm.

Stomach/Bowel: Stomach is within normal limits. Bowel shows no
evidence of obstruction or inflammation. No free air. No focal
lesions identified. Normal appendix.

Vascular/Lymphatic: No significant vascular findings are present. No
enlarged abdominal or pelvic lymph nodes.

Reproductive: Prostate is unremarkable.

Other: No abdominal wall hernia or abnormality. No abdominopelvic
ascites.

Musculoskeletal: Advanced degenerative disc disease present at L2-3
with disc space narrowing, prominent endplate sclerosis and roughly
5 mm retrolisthesis of L2 on L3.
IMPRESSION: Bladder calculus measuring approximately 2.4 cm in greatest
diameter. No renal or ureteral calculi identified.

## 2020-07-21 DIAGNOSIS — Z1389 Encounter for screening for other disorder: Secondary | ICD-10-CM | POA: Diagnosis not present

## 2020-07-21 DIAGNOSIS — Z23 Encounter for immunization: Secondary | ICD-10-CM | POA: Diagnosis not present

## 2020-07-21 DIAGNOSIS — R030 Elevated blood-pressure reading, without diagnosis of hypertension: Secondary | ICD-10-CM | POA: Diagnosis not present

## 2020-07-21 DIAGNOSIS — E782 Mixed hyperlipidemia: Secondary | ICD-10-CM | POA: Diagnosis not present

## 2020-07-21 DIAGNOSIS — Z0000 Encounter for general adult medical examination without abnormal findings: Secondary | ICD-10-CM | POA: Diagnosis not present

## 2021-07-29 DIAGNOSIS — Z0000 Encounter for general adult medical examination without abnormal findings: Secondary | ICD-10-CM | POA: Diagnosis not present

## 2021-07-29 DIAGNOSIS — I10 Essential (primary) hypertension: Secondary | ICD-10-CM | POA: Diagnosis not present

## 2021-07-29 DIAGNOSIS — E782 Mixed hyperlipidemia: Secondary | ICD-10-CM | POA: Diagnosis not present

## 2021-07-29 DIAGNOSIS — Z800 Family history of malignant neoplasm of digestive organs: Secondary | ICD-10-CM | POA: Diagnosis not present

## 2021-07-29 DIAGNOSIS — Z1389 Encounter for screening for other disorder: Secondary | ICD-10-CM | POA: Diagnosis not present

## 2021-08-17 DIAGNOSIS — I10 Essential (primary) hypertension: Secondary | ICD-10-CM | POA: Diagnosis not present

## 2021-08-31 DIAGNOSIS — I10 Essential (primary) hypertension: Secondary | ICD-10-CM | POA: Diagnosis not present

## 2022-01-25 DIAGNOSIS — I10 Essential (primary) hypertension: Secondary | ICD-10-CM | POA: Diagnosis not present

## 2022-01-25 DIAGNOSIS — M5451 Vertebrogenic low back pain: Secondary | ICD-10-CM | POA: Diagnosis not present

## 2022-01-25 DIAGNOSIS — G8929 Other chronic pain: Secondary | ICD-10-CM | POA: Diagnosis not present

## 2022-01-25 DIAGNOSIS — E782 Mixed hyperlipidemia: Secondary | ICD-10-CM | POA: Diagnosis not present

## 2022-01-25 DIAGNOSIS — Z23 Encounter for immunization: Secondary | ICD-10-CM | POA: Diagnosis not present

## 2022-02-02 DIAGNOSIS — M545 Low back pain, unspecified: Secondary | ICD-10-CM | POA: Diagnosis not present

## 2022-02-09 DIAGNOSIS — M545 Low back pain, unspecified: Secondary | ICD-10-CM | POA: Diagnosis not present

## 2022-08-18 DIAGNOSIS — G4700 Insomnia, unspecified: Secondary | ICD-10-CM | POA: Diagnosis not present

## 2022-08-18 DIAGNOSIS — Z800 Family history of malignant neoplasm of digestive organs: Secondary | ICD-10-CM | POA: Diagnosis not present

## 2022-08-18 DIAGNOSIS — Z1331 Encounter for screening for depression: Secondary | ICD-10-CM | POA: Diagnosis not present

## 2022-08-18 DIAGNOSIS — Z6830 Body mass index (BMI) 30.0-30.9, adult: Secondary | ICD-10-CM | POA: Diagnosis not present

## 2022-08-18 DIAGNOSIS — E782 Mixed hyperlipidemia: Secondary | ICD-10-CM | POA: Diagnosis not present

## 2022-08-18 DIAGNOSIS — Z125 Encounter for screening for malignant neoplasm of prostate: Secondary | ICD-10-CM | POA: Diagnosis not present

## 2022-08-18 DIAGNOSIS — M545 Low back pain, unspecified: Secondary | ICD-10-CM | POA: Diagnosis not present

## 2022-08-18 DIAGNOSIS — Z0000 Encounter for general adult medical examination without abnormal findings: Secondary | ICD-10-CM | POA: Diagnosis not present

## 2022-08-18 DIAGNOSIS — I10 Essential (primary) hypertension: Secondary | ICD-10-CM | POA: Diagnosis not present

## 2022-10-06 DIAGNOSIS — Z880 Allergy status to penicillin: Secondary | ICD-10-CM | POA: Diagnosis not present

## 2022-10-06 DIAGNOSIS — E785 Hyperlipidemia, unspecified: Secondary | ICD-10-CM | POA: Diagnosis not present

## 2022-10-06 DIAGNOSIS — E669 Obesity, unspecified: Secondary | ICD-10-CM | POA: Diagnosis not present

## 2022-10-06 DIAGNOSIS — Z6831 Body mass index (BMI) 31.0-31.9, adult: Secondary | ICD-10-CM | POA: Diagnosis not present

## 2022-10-06 DIAGNOSIS — I10 Essential (primary) hypertension: Secondary | ICD-10-CM | POA: Diagnosis not present

## 2022-10-06 DIAGNOSIS — Z008 Encounter for other general examination: Secondary | ICD-10-CM | POA: Diagnosis not present

## 2022-10-06 DIAGNOSIS — Z809 Family history of malignant neoplasm, unspecified: Secondary | ICD-10-CM | POA: Diagnosis not present

## 2022-10-12 DIAGNOSIS — G4719 Other hypersomnia: Secondary | ICD-10-CM | POA: Diagnosis not present

## 2022-10-12 DIAGNOSIS — I10 Essential (primary) hypertension: Secondary | ICD-10-CM | POA: Diagnosis not present

## 2022-10-27 DIAGNOSIS — G4733 Obstructive sleep apnea (adult) (pediatric): Secondary | ICD-10-CM | POA: Diagnosis not present

## 2023-07-12 DIAGNOSIS — K080 Exfoliation of teeth due to systemic causes: Secondary | ICD-10-CM | POA: Diagnosis not present

## 2023-08-29 DIAGNOSIS — J301 Allergic rhinitis due to pollen: Secondary | ICD-10-CM | POA: Diagnosis not present

## 2023-08-29 DIAGNOSIS — I10 Essential (primary) hypertension: Secondary | ICD-10-CM | POA: Diagnosis not present

## 2023-08-29 DIAGNOSIS — L03213 Periorbital cellulitis: Secondary | ICD-10-CM | POA: Diagnosis not present

## 2023-08-31 DIAGNOSIS — I10 Essential (primary) hypertension: Secondary | ICD-10-CM | POA: Diagnosis not present

## 2023-08-31 DIAGNOSIS — E782 Mixed hyperlipidemia: Secondary | ICD-10-CM | POA: Diagnosis not present

## 2023-08-31 DIAGNOSIS — Z125 Encounter for screening for malignant neoplasm of prostate: Secondary | ICD-10-CM | POA: Diagnosis not present

## 2023-09-04 DIAGNOSIS — Z0000 Encounter for general adult medical examination without abnormal findings: Secondary | ICD-10-CM | POA: Diagnosis not present

## 2023-09-04 DIAGNOSIS — Z1331 Encounter for screening for depression: Secondary | ICD-10-CM | POA: Diagnosis not present

## 2023-09-04 DIAGNOSIS — E782 Mixed hyperlipidemia: Secondary | ICD-10-CM | POA: Diagnosis not present

## 2023-09-04 DIAGNOSIS — I10 Essential (primary) hypertension: Secondary | ICD-10-CM | POA: Diagnosis not present

## 2023-09-04 DIAGNOSIS — G4733 Obstructive sleep apnea (adult) (pediatric): Secondary | ICD-10-CM | POA: Diagnosis not present

## 2023-09-04 DIAGNOSIS — E66811 Obesity, class 1: Secondary | ICD-10-CM | POA: Diagnosis not present

## 2023-10-10 DIAGNOSIS — G4733 Obstructive sleep apnea (adult) (pediatric): Secondary | ICD-10-CM | POA: Diagnosis not present

## 2023-10-10 DIAGNOSIS — I10 Essential (primary) hypertension: Secondary | ICD-10-CM | POA: Diagnosis not present

## 2023-10-10 DIAGNOSIS — Z6830 Body mass index (BMI) 30.0-30.9, adult: Secondary | ICD-10-CM | POA: Diagnosis not present

## 2023-10-10 DIAGNOSIS — Z131 Encounter for screening for diabetes mellitus: Secondary | ICD-10-CM | POA: Diagnosis not present

## 2023-10-10 DIAGNOSIS — E782 Mixed hyperlipidemia: Secondary | ICD-10-CM | POA: Diagnosis not present

## 2023-10-10 DIAGNOSIS — Z1331 Encounter for screening for depression: Secondary | ICD-10-CM | POA: Diagnosis not present

## 2023-10-10 DIAGNOSIS — E559 Vitamin D deficiency, unspecified: Secondary | ICD-10-CM | POA: Diagnosis not present

## 2023-10-10 DIAGNOSIS — E8889 Other specified metabolic disorders: Secondary | ICD-10-CM | POA: Diagnosis not present

## 2023-10-10 DIAGNOSIS — E66811 Obesity, class 1: Secondary | ICD-10-CM | POA: Diagnosis not present

## 2023-10-24 DIAGNOSIS — G4733 Obstructive sleep apnea (adult) (pediatric): Secondary | ICD-10-CM | POA: Diagnosis not present

## 2023-10-24 DIAGNOSIS — E782 Mixed hyperlipidemia: Secondary | ICD-10-CM | POA: Diagnosis not present

## 2023-10-24 DIAGNOSIS — E559 Vitamin D deficiency, unspecified: Secondary | ICD-10-CM | POA: Diagnosis not present

## 2023-10-24 DIAGNOSIS — I10 Essential (primary) hypertension: Secondary | ICD-10-CM | POA: Diagnosis not present

## 2023-10-26 DIAGNOSIS — G4733 Obstructive sleep apnea (adult) (pediatric): Secondary | ICD-10-CM | POA: Diagnosis not present

## 2023-11-06 DIAGNOSIS — G4733 Obstructive sleep apnea (adult) (pediatric): Secondary | ICD-10-CM | POA: Diagnosis not present

## 2023-11-07 DIAGNOSIS — I10 Essential (primary) hypertension: Secondary | ICD-10-CM | POA: Diagnosis not present

## 2023-11-07 DIAGNOSIS — E782 Mixed hyperlipidemia: Secondary | ICD-10-CM | POA: Diagnosis not present

## 2023-11-07 DIAGNOSIS — G4733 Obstructive sleep apnea (adult) (pediatric): Secondary | ICD-10-CM | POA: Diagnosis not present

## 2023-11-07 DIAGNOSIS — E559 Vitamin D deficiency, unspecified: Secondary | ICD-10-CM | POA: Diagnosis not present

## 2023-12-06 DIAGNOSIS — G4733 Obstructive sleep apnea (adult) (pediatric): Secondary | ICD-10-CM | POA: Diagnosis not present

## 2023-12-14 DIAGNOSIS — I10 Essential (primary) hypertension: Secondary | ICD-10-CM | POA: Diagnosis not present

## 2023-12-14 DIAGNOSIS — G4733 Obstructive sleep apnea (adult) (pediatric): Secondary | ICD-10-CM | POA: Diagnosis not present

## 2023-12-14 DIAGNOSIS — E782 Mixed hyperlipidemia: Secondary | ICD-10-CM | POA: Diagnosis not present

## 2023-12-14 DIAGNOSIS — E559 Vitamin D deficiency, unspecified: Secondary | ICD-10-CM | POA: Diagnosis not present

## 2023-12-21 DIAGNOSIS — G4733 Obstructive sleep apnea (adult) (pediatric): Secondary | ICD-10-CM | POA: Diagnosis not present

## 2024-01-06 DIAGNOSIS — G4733 Obstructive sleep apnea (adult) (pediatric): Secondary | ICD-10-CM | POA: Diagnosis not present

## 2024-01-10 DIAGNOSIS — K080 Exfoliation of teeth due to systemic causes: Secondary | ICD-10-CM | POA: Diagnosis not present

## 2024-02-06 DIAGNOSIS — G4733 Obstructive sleep apnea (adult) (pediatric): Secondary | ICD-10-CM | POA: Diagnosis not present

## 2024-02-22 DIAGNOSIS — E782 Mixed hyperlipidemia: Secondary | ICD-10-CM | POA: Diagnosis not present

## 2024-02-22 DIAGNOSIS — G4733 Obstructive sleep apnea (adult) (pediatric): Secondary | ICD-10-CM | POA: Diagnosis not present

## 2024-02-22 DIAGNOSIS — E559 Vitamin D deficiency, unspecified: Secondary | ICD-10-CM | POA: Diagnosis not present

## 2024-02-22 DIAGNOSIS — I10 Essential (primary) hypertension: Secondary | ICD-10-CM | POA: Diagnosis not present

## 2024-03-04 DIAGNOSIS — R7303 Prediabetes: Secondary | ICD-10-CM | POA: Diagnosis not present

## 2024-03-04 DIAGNOSIS — E782 Mixed hyperlipidemia: Secondary | ICD-10-CM | POA: Diagnosis not present

## 2024-03-04 DIAGNOSIS — E559 Vitamin D deficiency, unspecified: Secondary | ICD-10-CM | POA: Diagnosis not present

## 2024-03-04 DIAGNOSIS — I10 Essential (primary) hypertension: Secondary | ICD-10-CM | POA: Diagnosis not present

## 2024-03-06 DIAGNOSIS — M25561 Pain in right knee: Secondary | ICD-10-CM | POA: Diagnosis not present

## 2024-03-06 DIAGNOSIS — M25562 Pain in left knee: Secondary | ICD-10-CM | POA: Diagnosis not present
